# Patient Record
Sex: Female | Born: 1980 | Race: White | Hispanic: No | Marital: Married | Smoking: Never smoker
Health system: Southern US, Community
[De-identification: ages and names within clinical notes are randomized; demographics above are authoritative.]

## PROBLEM LIST (undated history)

## (undated) HISTORY — PX: BREAST SURGERY: SHX581

---
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
## 1999-11-14 ENCOUNTER — Encounter: Admission: RE | Admit: 1999-11-14 | Discharge: 1999-11-14 | Payer: Self-pay | Admitting: *Deleted

## 1999-12-28 ENCOUNTER — Ambulatory Visit (HOSPITAL_BASED_OUTPATIENT_CLINIC_OR_DEPARTMENT_OTHER): Admission: RE | Admit: 1999-12-28 | Discharge: 1999-12-28 | Payer: Self-pay | Admitting: *Deleted

## 1999-12-28 ENCOUNTER — Encounter (INDEPENDENT_AMBULATORY_CARE_PROVIDER_SITE_OTHER): Payer: Self-pay | Admitting: *Deleted

## 2000-11-21 ENCOUNTER — Other Ambulatory Visit: Admission: RE | Admit: 2000-11-21 | Discharge: 2000-11-21 | Payer: Self-pay | Admitting: Obstetrics and Gynecology

## 2002-01-04 ENCOUNTER — Other Ambulatory Visit: Admission: RE | Admit: 2002-01-04 | Discharge: 2002-01-04 | Payer: Self-pay | Admitting: Obstetrics and Gynecology

## 2004-02-17 ENCOUNTER — Other Ambulatory Visit: Admission: RE | Admit: 2004-02-17 | Discharge: 2004-02-17 | Payer: Self-pay | Admitting: Obstetrics and Gynecology

## 2005-02-27 ENCOUNTER — Other Ambulatory Visit: Admission: RE | Admit: 2005-02-27 | Discharge: 2005-02-27 | Payer: Self-pay | Admitting: Obstetrics and Gynecology

## 2006-03-04 ENCOUNTER — Other Ambulatory Visit: Admission: RE | Admit: 2006-03-04 | Discharge: 2006-03-04 | Payer: Self-pay | Admitting: Obstetrics and Gynecology

## 2007-07-15 ENCOUNTER — Inpatient Hospital Stay (HOSPITAL_COMMUNITY): Admission: AD | Admit: 2007-07-15 | Discharge: 2007-07-15 | Payer: Self-pay | Admitting: Obstetrics and Gynecology

## 2007-08-11 ENCOUNTER — Inpatient Hospital Stay (HOSPITAL_COMMUNITY): Admission: AD | Admit: 2007-08-11 | Discharge: 2007-08-14 | Payer: Self-pay | Admitting: Obstetrics and Gynecology

## 2007-08-12 ENCOUNTER — Encounter (INDEPENDENT_AMBULATORY_CARE_PROVIDER_SITE_OTHER): Payer: Self-pay

## 2009-09-12 DIAGNOSIS — D229 Melanocytic nevi, unspecified: Secondary | ICD-10-CM

## 2009-09-12 HISTORY — DX: Melanocytic nevi, unspecified: D22.9

## 2010-02-05 ENCOUNTER — Inpatient Hospital Stay (HOSPITAL_COMMUNITY)
Admission: AD | Admit: 2010-02-05 | Discharge: 2010-02-07 | Payer: Self-pay | Source: Home / Self Care | Attending: Obstetrics and Gynecology | Admitting: Obstetrics and Gynecology

## 2010-04-30 LAB — CBC
HCT: 27.8 % — ABNORMAL LOW (ref 36.0–46.0)
HCT: 36.3 % (ref 36.0–46.0)
Hemoglobin: 12.4 g/dL (ref 12.0–15.0)
Hemoglobin: 9.5 g/dL — ABNORMAL LOW (ref 12.0–15.0)
MCH: 30.8 pg (ref 26.0–34.0)
MCH: 30.9 pg (ref 26.0–34.0)
MCHC: 34.2 g/dL (ref 30.0–36.0)
MCHC: 34.2 g/dL (ref 30.0–36.0)
MCV: 90.3 fL (ref 78.0–100.0)
MCV: 90.5 fL (ref 78.0–100.0)
Platelets: 217 10*3/uL (ref 150–400)
Platelets: 242 10*3/uL (ref 150–400)
RBC: 3.08 MIL/uL — ABNORMAL LOW (ref 3.87–5.11)
RBC: 4.01 MIL/uL (ref 3.87–5.11)
RDW: 13.7 % (ref 11.5–15.5)
RDW: 14 % (ref 11.5–15.5)
WBC: 12.3 10*3/uL — ABNORMAL HIGH (ref 4.0–10.5)
WBC: 15 10*3/uL — ABNORMAL HIGH (ref 4.0–10.5)

## 2010-04-30 LAB — RPR: RPR Ser Ql: NONREACTIVE

## 2010-07-03 NOTE — H&P (Signed)
Ann, Hoffman                  ACCOUNT NO.:  [id number]   MEDICAL RECORD NO.:  [id number]          PATIENT TYPE:  INP   LOCATION:  9168                          FACILITY:  [facility]   PHYSICIAN:  Hal Morales, M.D.DATE OF BIRTH:  Oct 19, 1980   DATE OF ADMISSION:  08/11/2007  DATE OF DISCHARGE:                              HISTORY & PHYSICAL   HISTORY:  [name] is a 30 year old married white female gravida 2,  para 0-0-1-0 at 39-0/7th week.  She presents with leaking light green  fluids since 9 p.m. tonight with onset of regular uterine contractions  while she was on her way to [facility].  Her pregnancy has been  followed by the [facility] Certified Nurse Midwife [facility]  and has been remarkable for:  1. Irregular menses.  2. History of bilateral lumpectomies.  3. 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  4. Latex allergy.  5. Group B Strep negative.   Her prenatal labs were collected on January 27, 2007.  Hemoglobin 11.9,  hematocrit 34.6, platelets 232,000, blood type A positive, antibody  negative, RPR is nonreactive, rubella immune, hepatitis B surface  antigen negative, HIV nonreactive, and cystic fibrosis negative.  First  trimester screen from February 10, 2007, was within normal limits.  AFP  from March 18, 2007, was within normal limits.  1-hour Glucola from  May 14, 2007, was 117.  RPR at that time was nonreactive.  Hemoglobin  at that time was 11.8.  Cultures of vaginal tract for group B Strep on  July 23, 2007, was negative.   HISTORY OF PRESENT PREGNANCY:  The patient presented for care at [facility] on January 27, 2007, at 11 and 2/7th weeks gestation.  EDC was  determined by a 7-week ultrasound and that was to be on August 18, 2007.  Urine culture from the new OB showed positive Klebsiella pneumonia.  She  was given Bactrim for that.  She had a normal first trimester screen.  Anatomy ultrasound of [redacted] weeks gestation shows growth consistent with  previous dating.  All anatomy was seen.  She had a normal AST at that  ultrasound visit.  She had a urine culture sent that was positive for  enterococcus and that was at [redacted] weeks gestation.  She was treated with  Macrobid for that and then was started on suppressive therapy 100 mg  daily.  She had a normal 1-hour Glucola to considering Micronor for  contraception postpartum.  Rest of prenatal care was unremarkable.   OBSTETRIC HISTORY:  She is a gravida 2, para 0-0-1-0.  In July 2011, she  had an elective AB at 5-6 weeks' gestation.  This is second pregnancy is  the current pregnancy.   PAST MEDICAL HISTORY:  She is allergic to LATEX, resulting in hives.  She experienced menarche at the age of 47 with 28 day cycles lasting 5  days.  She states, she has been in the past for contraception, and she  stopped in January 2008.  She  had fibrocystic right breast with 2 cysts  removed in 2000 and 2001.  She reports having had the usual childhood  illnesses.   SURGICAL HISTORY:  Remarkable for lumpectomy x2 and wisdom teeth  extraction.   FAMILY MEDICAL HISTORY:  Paternal grandmother with angina.  Paternal  grandfather with 3 MIs.  Multiple family members with chronic  hypertension.  Mother with asthma.  Paternal uncle with type 1 diabetes.  Mother with hypothyroidism.  Paternal grandmother with CVA.  Paternal  grandmother with rheumatoid arthritis.  Maternal grandfather with colon  cancer.   GENETIC HISTORY:  Remarkable for twins on father of the baby side of the  family.   SOCIAL HISTORY:  The patient is married, lives with father of the baby.  His name is Riki Rusk.  He is involved and supportive.  They are of the  [place] faith.  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  Father of the baby has associates and he is  employed full time.  They deny alcohol, tobacco, or illicit drug use   with pregnancy.   OBJECTIVE DATA:  VITAL SIGNS:  Stable.  She is afebrile.  HEENT:  Grossly within normal limits.  CHEST:  Clear to auscultation.  HEART:  Regular rate and rhythm.  ABDOMEN:  Gravid and contour with fundal height extending approximately  38 cm above pubic symphysis.  Fetal heart rate is reassuring.  Negative  CST and positive scalp stimulation.  Contractions every 2-3 minutes.  Cervix is 2+ , 80% effaced, and vertex -1 with thin meconium-stained  fluid noted.  EXTREMITIES:  Normal.   ASSESSMENT:  1. Intrauterine pregnancy at term.  2. Spontaneous rupture of membranes.  3. Early labor.   PLAN:  1. To admit to birthing suite.  2. Routine CNM orders.  3. Undecided regarding pain medication.  4. Anticipated normal spontaneous vaginal birth.      Cam Hai, C.N.M.      Hal Morales, M.D.  Electronically Signed    KS/MEDQ  D:  08/12/2007  T:  08/12/2007  Job:  [id number]

## 2010-07-06 NOTE — Op Note (Signed)
[place]  Patient:    Ann Hoffman, Ann Hoffman                      MRN: [id number] Proc. Date: 12/28/99 Adm. Date:  [id number] Attending:  Stephenie Acres                           Operative Report  PREOPERATIVE DIAGNOSIS:  Right breast mass.  POSTOPERATIVE DIAGNOSIS:  Right breast mass.  OPERATION:  Excisional right breast biopsy.  SURGEON:  Catalina Lunger, M.D.  ANESTHESIA:  Local MAC  DESCRIPTION OF PROCEDURE:  The patient was taken to the operating room and placed in the supine position.  After adequate anesthesia was induced, the right breast was prepped and draped in a normal sterile fashion.  Using 0.25% Marcaine, the skin and subcutaneous tissue was injected.  Small incision was made over the palpable mass.  The mass was excised in its entirety.  Adequate hemostasis was ensured and the skin was closed with subcuticular 4-0 Monocryl. Steri-Strips and sterile dressing was applied.  The patient tolerated the procedure well and went to PACU in good condition. DD:  12/28/99 TD:  12/29/99 Job: [id number]

## 2010-11-14 LAB — URINE MICROSCOPIC-ADD ON

## 2010-11-14 LAB — STREP B DNA PROBE: Strep Group B Ag: NEGATIVE

## 2010-11-14 LAB — URINALYSIS, ROUTINE W REFLEX MICROSCOPIC
Bilirubin Urine: NEGATIVE
Glucose, UA: NEGATIVE
Hgb urine dipstick: NEGATIVE
Ketones, ur: NEGATIVE
Protein, ur: NEGATIVE
Urobilinogen, UA: 0.2

## 2010-11-15 LAB — RPR: RPR Ser Ql: NONREACTIVE

## 2010-11-15 LAB — CBC
HCT: 27.5 — ABNORMAL LOW
HCT: 34.6 — ABNORMAL LOW
Hemoglobin: 12
Hemoglobin: 9.6 — ABNORMAL LOW
MCHC: 34.3
MCV: 97.8
Platelets: 178
Platelets: 192
RBC: 2.84 — ABNORMAL LOW
RBC: 3.54 — ABNORMAL LOW
WBC: 10.2
WBC: 11.6 — ABNORMAL HIGH
WBC: 14.8 — ABNORMAL HIGH

## 2010-11-15 LAB — DIFFERENTIAL
Eosinophils Relative: 2
Lymphocytes Relative: 18
Lymphs Abs: 1.8

## 2010-11-15 LAB — CCBB MATERNAL DONOR DRAW

## 2012-01-29 ENCOUNTER — Ambulatory Visit (INDEPENDENT_AMBULATORY_CARE_PROVIDER_SITE_OTHER): Payer: 59

## 2012-01-29 VITALS — BP 100/62 | Resp 16 | Wt 127.0 lb

## 2012-01-29 DIAGNOSIS — Z124 Encounter for screening for malignant neoplasm of cervix: Secondary | ICD-10-CM

## 2012-01-29 NOTE — Progress Notes (Signed)
The patient reports:no complaints  Contraception:oral contraceptives (estrogen/progesterone)  Last mammogram: not since 2000 when the cysts were discovered Last pap: normal January 18, 2011  GC/Chlamydia cultures offered: declined HIV/RPR/HbsAg offered:  declined HSV 1 and 2 glycoprotein offered: declined  Menstrual cycle regular and monthly: Yes Menstrual flow normal: Yes  Urinary symptoms: none Normal bowel movements: Yes Reports abuse at home: No  .

## 2012-01-30 LAB — PAP IG W/ RFLX HPV ASCU

## 2012-02-06 ENCOUNTER — Other Ambulatory Visit: Payer: Self-pay

## 2012-02-06 DIAGNOSIS — Z309 Encounter for contraceptive management, unspecified: Secondary | ICD-10-CM

## 2012-02-06 MED ORDER — NORETHINDRONE 0.35 MG PO TABS: 1.0000 | ORAL_TABLET | Freq: Every day | ORAL | Status: AC

## 2012-03-05 ENCOUNTER — Telehealth: Payer: Self-pay | Admitting: Obstetrics and Gynecology

## 2012-03-05 NOTE — Telephone Encounter (Signed)
Spoke with pt rgd msg pt states had two cycles in one month first time its occurred no missed pills taking bc as directed advised pt not sure why having break thru bleeding may be that she needs another bc with more estrogen advised pt can bring her in for ov and eval or she can monitor and see if bleeding occurs next month then come in for eval pt states will monitor and see if irreg bleeding occurs again

## 2012-03-05 NOTE — Telephone Encounter (Signed)
Lm on vm tcb rgd msg 

## 2012-04-13 ENCOUNTER — Telehealth: Payer: Self-pay | Admitting: Obstetrics and Gynecology

## 2012-04-13 NOTE — Telephone Encounter (Signed)
TC to pt. States did not have menses as expected end of Jan.  Had bleeding x 2 days 2/13-2/14/14 which was middle of pack of pills. Advised to do UPT  Sched with [facility] 04/17/12 to accommodate pt's work schedule.

## 2012-04-17 ENCOUNTER — Ambulatory Visit: Payer: 59 | Admitting: Family Medicine

## 2012-04-17 ENCOUNTER — Encounter: Payer: Self-pay | Admitting: Family Medicine

## 2012-04-17 VITALS — BP 90/66 | Temp 98.0°F | Resp 18 | Wt 120.0 lb

## 2012-04-17 DIAGNOSIS — Z3202 Encounter for pregnancy test, result negative: Secondary | ICD-10-CM

## 2012-04-17 DIAGNOSIS — Z3041 Encounter for surveillance of contraceptive pills: Secondary | ICD-10-CM

## 2012-04-17 DIAGNOSIS — N921 Excessive and frequent menstruation with irregular cycle: Secondary | ICD-10-CM

## 2012-04-17 LAB — POCT URINALYSIS DIPSTICK
Bilirubin, UA: NEGATIVE
Blood, UA: NEGATIVE
Glucose, UA: NEGATIVE
Ketones, UA: NEGATIVE
Leukocytes, UA: NEGATIVE
Nitrite, UA: NEGATIVE
Protein, UA: NEGATIVE
Spec Grav, UA: 1.01
Urobilinogen, UA: NEGATIVE
pH, UA: 7

## 2012-04-17 LAB — POCT URINE PREGNANCY: Preg Test, Ur: NEGATIVE

## 2012-04-17 MED ORDER — NORGESTIM-ETH ESTRAD TRIPHASIC 0.18/0.215/0.25 MG-25 MCG PO TABS: 1.0000 | ORAL_TABLET | Freq: Every day | ORAL | Status: AC

## 2012-04-17 NOTE — Progress Notes (Signed)
S: Patient presents with c/o of irregular menses. Last normal menses was December, then started bleeding in January for 2 weeks, in February bleed x 1 day, continued bills thru 2/27 and currently on week 4 and still no bleeding.  Denies any new medications, lost 22# with diet and exercise, since August, but no other changes, takes pills daily and on time, not missing pills or skipping, last pap 01/2012 and normal.  Hx of irregualar menses in high school when off pills, otherwise been regular.  Denies pelvic pain, odor, discharge, or bleeding with intercourse. Nonsmoker.  Maternal history of hypothyroidism, patient only reports gradual hair loss, but not unusual.  No voice changes, heat or cold intolerances, constipation.  O: EGBUS: WNL, parous cervix without discharge or lesions, no CMT.  Adnexa without masses or tenderness. Uterus: mobile, nontender and appropriate size.  Small amount of brown blood in the vault.  A: Breakthrough Bleeding     UPT negative     TSH pending  P: D/C camila, Sunday Start Ortho-tricyclen LO 1 PO daily, 3 refills with condoms/foam/film for back up x 4 weeks.     RTO in 12 weeks for f/u

## 2012-04-18 LAB — TSH: TSH: 0.757 u[IU]/mL (ref 0.350–4.500)

## 2012-10-12 NOTE — Progress Notes (Signed)
..   Subjective:    Ann Hoffman is a 31y.o. MW female, [phone], who presents for an annual exam.  Pt is an Charity fundraiser.  Undecided on future pregnancies.   The patient reports:no complaints Contraception:oral contraceptives (estrogen/progesterone)  Last mammogram: not since 2000 when the cysts were discovered  Last pap: normal January 18, 2011  GC/Chlamydia cultures offered: declined  HIV/RPR/HbsAg offered: declined  HSV 1 and 2 glycoprotein offered: declined  Menstrual cycle regular and monthly: Yes  Menstrual flow normal: Yes  Urinary symptoms: none  Normal bowel movements: Yes  Reports abuse at home: No    History   Social History  . Marital Status: Married    Spouse Name: N/A    Number of Children: N/A  . Years of Education: N/A   Social History Main Topics  . Smoking status: Never Smoker   . Smokeless tobacco: Never Used  . Alcohol Use: 0.6 oz/week    1 Glasses of wine per week  . Drug Use: No  . Sexual Activity: Yes    Partners: Male    Birth Control/ Protection: Pill   Other Topics Concern  . None   Social History Narrative  . None   Past surgical Hx:  Bilateral breast lumpectomies--fibrocystic No PMH  Menstrual cycle:   LMP: Patient's last menstrual period was 01/23/2012.           Cycle: monthly on pills  The following portions of the patient's history were reviewed and updated as appropriate: allergies, current medications, past family history, past medical history, past social history, past surgical history and problem list.  Review of Systems Pertinent items are noted in HPI. Breast:Negative for breast lump,nipple discharge or nipple retraction Gastrointestinal: Negative for abdominal pain, change in bowel habits or rectal bleeding Urinary:negative   Objective:    BP 100/62  Resp 16  Wt 127 lb (57.607 kg)  LMP 01/23/2012    Weight:  Wt Readings from Last 1 Encounters:  04/17/12 120 lb (54.432 kg)          BMI: There is no height on file to calculate  BMI.  General Appearance: Alert, appropriate appearance for age. No acute distress HEENT: Grossly normal Neck / Thyroid: Supple, no masses, nodes or enlargement Lungs: clear to auscultation bilaterally Back: No CVA tenderness Breast Exam: No dimpling, nipple retraction or discharge. No masses or nodes. and No masses or nodes.No dimpling, nipple retraction or discharge. Cardiovascular: Regular rate and rhythm.  Gastrointestinal: Soft, non-tender, no masses or organomegaly Pelvic Exam: Vulva and vagina appear normal. Bimanual exam reveals normal uterus and adnexa. Rectovaginal: not indicated Lymphatic Exam: Non-palpable nodes in neck, clavicular, axillary. Skin: no rash or abnormalities Neurologic: Normal gait and speech, no tremor  Psychiatric: Alert and oriented, appropriate affect.   Wet Prep:not applicable Urinalysis:not applicable UPT: Not done   Assessment:    Normal gyn exam    Plan:    Mammogram--age 2 or prn pap smear done, next pap due 2014-2015 return annually or prn STD screening: declined Contraception:oral contraceptives (estrogen/progesterone) Rec'd daily vit, sunscreen, Kegels, healthy diet/exercise,lifestyle.  Rexene Edison, CNM Late entry from 01/29/12

## 2013-08-06 ENCOUNTER — Other Ambulatory Visit: Payer: Self-pay | Admitting: Physician Assistant

## 2013-12-20 ENCOUNTER — Encounter: Payer: Self-pay | Admitting: Family Medicine

## 2014-03-08 ENCOUNTER — Other Ambulatory Visit: Payer: Self-pay | Admitting: Physician Assistant

## 2016-06-20 ENCOUNTER — Other Ambulatory Visit: Payer: Self-pay | Admitting: Physician Assistant

## 2019-01-12 ENCOUNTER — Other Ambulatory Visit: Payer: Self-pay | Admitting: Physician Assistant

## 2019-01-12 DIAGNOSIS — D485 Neoplasm of uncertain behavior of skin: Secondary | ICD-10-CM | POA: Diagnosis not present

## 2019-01-12 DIAGNOSIS — D2362 Other benign neoplasm of skin of left upper limb, including shoulder: Secondary | ICD-10-CM | POA: Diagnosis not present

## 2019-02-02 DIAGNOSIS — Z01419 Encounter for gynecological examination (general) (routine) without abnormal findings: Secondary | ICD-10-CM | POA: Diagnosis not present

## 2019-02-02 DIAGNOSIS — Z6824 Body mass index (BMI) 24.0-24.9, adult: Secondary | ICD-10-CM | POA: Diagnosis not present

## 2019-02-22 DIAGNOSIS — Z20828 Contact with and (suspected) exposure to other viral communicable diseases: Secondary | ICD-10-CM | POA: Diagnosis not present

## 2019-04-09 DIAGNOSIS — Z30430 Encounter for insertion of intrauterine contraceptive device: Secondary | ICD-10-CM | POA: Diagnosis not present

## 2019-04-09 DIAGNOSIS — Z124 Encounter for screening for malignant neoplasm of cervix: Secondary | ICD-10-CM | POA: Diagnosis not present

## 2019-04-09 DIAGNOSIS — Z113 Encounter for screening for infections with a predominantly sexual mode of transmission: Secondary | ICD-10-CM | POA: Diagnosis not present

## 2019-05-11 DIAGNOSIS — R21 Rash and other nonspecific skin eruption: Secondary | ICD-10-CM | POA: Diagnosis not present

## 2019-05-11 DIAGNOSIS — J3081 Allergic rhinitis due to animal (cat) (dog) hair and dander: Secondary | ICD-10-CM | POA: Diagnosis not present

## 2019-05-11 DIAGNOSIS — J301 Allergic rhinitis due to pollen: Secondary | ICD-10-CM | POA: Diagnosis not present

## 2019-05-11 DIAGNOSIS — T781XXD Other adverse food reactions, not elsewhere classified, subsequent encounter: Secondary | ICD-10-CM | POA: Diagnosis not present

## 2019-06-01 DIAGNOSIS — Z30431 Encounter for routine checking of intrauterine contraceptive device: Secondary | ICD-10-CM | POA: Diagnosis not present

## 2019-07-13 ENCOUNTER — Encounter: Payer: Self-pay | Admitting: Physician Assistant

## 2019-07-13 ENCOUNTER — Ambulatory Visit: Payer: BC Managed Care – PPO | Admitting: Physician Assistant

## 2019-07-13 ENCOUNTER — Other Ambulatory Visit: Payer: Self-pay

## 2019-07-13 DIAGNOSIS — Z86018 Personal history of other benign neoplasm: Secondary | ICD-10-CM

## 2019-07-13 DIAGNOSIS — K130 Diseases of lips: Secondary | ICD-10-CM | POA: Diagnosis not present

## 2019-07-13 DIAGNOSIS — Z1283 Encounter for screening for malignant neoplasm of skin: Secondary | ICD-10-CM

## 2019-07-13 NOTE — Progress Notes (Signed)
   Follow-Up Visit   Subjective  Ann Hoffman is a 39 y.o. female who presents for the following: Follow-up (6 month mole check).   The following portions of the chart were reviewed this encounter and updated as appropriate: Tobacco  Allergies  Meds  Problems  Med Hx  Surg Hx  Fam Hx      Objective  Well appearing patient in no apparent distress; mood and affect are within normal limits.  A full examination was performed including scalp, head, eyes, ears, nose, lips, neck, chest, axillae, abdomen, back, buttocks, bilateral upper extremities, bilateral lower extremities, hands, feet, fingers, toes, fingernails, and toenails. All findings within normal limits unless otherwise noted below.  Objective  Left Lower Cutaneous Lip, Right Lower Cutaneous Lip: Erythematous scale  Objective  Head to toe: No DN, no signs of NMSC   Assessment & Plan  Angular cheilitis (2) Left Lower Cutaneous Lip; Right Lower Cutaneous Lip  Hydrocortisone ointment, clotrimazole cream  History of dysplastic nevus Right Shoulder - Posterior  Screening exam for skin cancer Head to toe  Yearly skin exam

## 2019-11-10 DIAGNOSIS — Z23 Encounter for immunization: Secondary | ICD-10-CM | POA: Diagnosis not present

## 2020-01-06 DIAGNOSIS — H05011 Cellulitis of right orbit: Secondary | ICD-10-CM | POA: Diagnosis not present

## 2020-02-01 ENCOUNTER — Other Ambulatory Visit: Payer: Self-pay

## 2020-02-01 ENCOUNTER — Telehealth: Payer: Self-pay | Admitting: *Deleted

## 2020-02-01 ENCOUNTER — Encounter: Payer: Self-pay | Admitting: Dermatology

## 2020-02-01 ENCOUNTER — Ambulatory Visit (INDEPENDENT_AMBULATORY_CARE_PROVIDER_SITE_OTHER): Payer: BC Managed Care – PPO | Admitting: Dermatology

## 2020-02-01 DIAGNOSIS — L700 Acne vulgaris: Secondary | ICD-10-CM

## 2020-02-01 MED ORDER — WINLEVI 1 % EX CREA: 1.0000 "application " | TOPICAL_CREAM | Freq: Every day | CUTANEOUS | 1 refills | Status: AC

## 2020-02-01 NOTE — Telephone Encounter (Signed)
Prior Authorization needed for Winlevi 1% Cream- done via cover my meds- Prior Authorization Approved.    Ann Hoffman (Key: [id number])  This request has received a Favorable outcome from [place].  Please keep in mind this is not a guarantee of payment. Eligibility and Benefit determinations will be made at the time of service.  Please note any additional information provided by [facility]  at the bottom of the screen.

## 2020-02-04 ENCOUNTER — Encounter: Payer: Self-pay | Admitting: Dermatology

## 2020-02-04 NOTE — Progress Notes (Signed)
   Follow-Up Visit   Subjective  Ann Hoffman is a 39 y.o. female who presents for the following: Acne (Patient d/c oral birth control now IUD acne on face tx sal acid, bpo wash and cream).  acne Location: Face Duration:  Quality: Flaring Associated Signs/Symptoms: Modifying Factors: Oral contraceptives Severity:  Timing: Context:   Objective  Well appearing patient in no apparent distress; mood and affect are within normal limits. Objective  Head - Anterior (Face): Acne chin under mask, historically deep inflammatory papules.  No clear-cut historical relationship to starting/stopping hormone therapies.  We discussed essentially old treatment options but in view of the hormone dependent relationship she and I agreed that a trial with class called her own seems most reasonable. Failed = minocycline 100mg  Benzaclin gel Retin A micro Accutane OTC BPO, DIFFERIN GEL    A focused examination was performed including Head and neck.. Relevant physical exam findings are noted in the Assessment and Plan.   Assessment & Plan    Acne vulgaris Head - Anterior (Face)  When lately applied to areas prone to acne nightly for 6 to 8-week trial.  Follow-up by MyChart or phone at that time.      I, Lavonna Monarch, MD, have reviewed all documentation for this visit.  The documentation on 02/04/20 for the exam, diagnosis, procedures, and orders are all accurate and complete.

## 2020-02-08 DIAGNOSIS — Z01419 Encounter for gynecological examination (general) (routine) without abnormal findings: Secondary | ICD-10-CM | POA: Diagnosis not present

## 2020-02-08 DIAGNOSIS — Z1231 Encounter for screening mammogram for malignant neoplasm of breast: Secondary | ICD-10-CM | POA: Diagnosis not present

## 2020-05-09 DIAGNOSIS — T781XXD Other adverse food reactions, not elsewhere classified, subsequent encounter: Secondary | ICD-10-CM | POA: Diagnosis not present

## 2020-05-09 DIAGNOSIS — J301 Allergic rhinitis due to pollen: Secondary | ICD-10-CM | POA: Diagnosis not present

## 2020-05-09 DIAGNOSIS — J3081 Allergic rhinitis due to animal (cat) (dog) hair and dander: Secondary | ICD-10-CM | POA: Diagnosis not present

## 2020-05-09 DIAGNOSIS — R21 Rash and other nonspecific skin eruption: Secondary | ICD-10-CM | POA: Diagnosis not present

## 2020-07-18 ENCOUNTER — Ambulatory Visit: Payer: BC Managed Care – PPO | Admitting: Physician Assistant

## 2020-10-10 DIAGNOSIS — Z0000 Encounter for general adult medical examination without abnormal findings: Secondary | ICD-10-CM | POA: Diagnosis not present

## 2020-10-10 DIAGNOSIS — Z13220 Encounter for screening for lipoid disorders: Secondary | ICD-10-CM | POA: Diagnosis not present

## 2020-10-10 DIAGNOSIS — Z1211 Encounter for screening for malignant neoplasm of colon: Secondary | ICD-10-CM | POA: Diagnosis not present

## 2020-10-10 DIAGNOSIS — Z0001 Encounter for general adult medical examination with abnormal findings: Secondary | ICD-10-CM | POA: Diagnosis not present

## 2020-10-10 DIAGNOSIS — E559 Vitamin D deficiency, unspecified: Secondary | ICD-10-CM | POA: Diagnosis not present

## 2020-10-10 DIAGNOSIS — F909 Attention-deficit hyperactivity disorder, unspecified type: Secondary | ICD-10-CM | POA: Diagnosis not present

## 2020-10-10 DIAGNOSIS — Z1231 Encounter for screening mammogram for malignant neoplasm of breast: Secondary | ICD-10-CM | POA: Diagnosis not present

## 2020-10-17 ENCOUNTER — Ambulatory Visit: Payer: BC Managed Care – PPO | Admitting: Physician Assistant

## 2020-10-17 ENCOUNTER — Other Ambulatory Visit: Payer: Self-pay

## 2020-10-17 ENCOUNTER — Encounter: Payer: Self-pay | Admitting: Physician Assistant

## 2020-10-17 DIAGNOSIS — Z808 Family history of malignant neoplasm of other organs or systems: Secondary | ICD-10-CM | POA: Diagnosis not present

## 2020-10-17 DIAGNOSIS — Z86018 Personal history of other benign neoplasm: Secondary | ICD-10-CM

## 2020-10-17 DIAGNOSIS — Z1283 Encounter for screening for malignant neoplasm of skin: Secondary | ICD-10-CM

## 2020-10-17 NOTE — Progress Notes (Signed)
   Follow-Up Visit   Subjective  Ann Hoffman is a 40 y.o. female who presents for the following: Annual Exam (No new concerns. Personal history of several atypical nevi but no non mole skin cancer.  Does have family history of melanoma. ).   The following portions of the chart were reviewed this encounter and updated as appropriate:  Tobacco  Allergies  Meds  Problems  Med Hx  Surg Hx  Fam Hx      Objective  Well appearing patient in no apparent distress; mood and affect are within normal limits.  A full examination was performed including scalp, head, eyes, ears, nose, lips, neck, chest, axillae, abdomen, back, buttocks, bilateral upper extremities, bilateral lower extremities, hands, feet, fingers, toes, fingernails, and toenails. All findings within normal limits unless otherwise noted below.  Head to toe No atypical nevi No signs of non-mole skin cancer.    Assessment & Plan  Encounter for screening for malignant neoplasm of skin Head to toe  Yearly skin examinations.    I, Eliese Kerwood, PA-C, have reviewed all documentation's for this visit.  The documentation on 10/17/20 for the exam, diagnosis, procedures and orders are all accurate and complete.

## 2020-11-09 DIAGNOSIS — Z23 Encounter for immunization: Secondary | ICD-10-CM | POA: Diagnosis not present

## 2020-12-12 DIAGNOSIS — Z79899 Other long term (current) drug therapy: Secondary | ICD-10-CM | POA: Diagnosis not present

## 2020-12-12 DIAGNOSIS — F902 Attention-deficit hyperactivity disorder, combined type: Secondary | ICD-10-CM | POA: Diagnosis not present

## 2021-01-17 ENCOUNTER — Telehealth: Payer: Self-pay | Admitting: *Deleted

## 2021-01-17 NOTE — Telephone Encounter (Signed)
Prior authorization done through cover my meds. [id number]  MIAMI LATULIPPE (Key: [id number])  Your information has been submitted to [place]. [employer] [place] will review the request and notify you of the determination decision directly, typically within 72 hours of receiving all information.  You will also receive your request decision electronically. To check for an update later, open this request again from your dashboard.  If [employer] [place] has not responded within the specified timeframe or if you have any questions about your PA submission, contact [place]  directly at [phone].

## 2021-01-22 ENCOUNTER — Telehealth: Payer: Self-pay | Admitting: *Deleted

## 2021-01-22 MED ORDER — WINLEVI 1 % EX CREA: 1.0000 "application " | TOPICAL_CREAM | Freq: Every day | CUTANEOUS | 3 refills | Status: AC

## 2021-01-22 NOTE — Telephone Encounter (Signed)
Patients Ann Hoffman was denied. Sent to [facility]. Information sent to the patient.

## 2021-02-27 DIAGNOSIS — Z01419 Encounter for gynecological examination (general) (routine) without abnormal findings: Secondary | ICD-10-CM | POA: Diagnosis not present

## 2021-02-27 DIAGNOSIS — Z1231 Encounter for screening mammogram for malignant neoplasm of breast: Secondary | ICD-10-CM | POA: Diagnosis not present

## 2021-03-06 DIAGNOSIS — F909 Attention-deficit hyperactivity disorder, unspecified type: Secondary | ICD-10-CM | POA: Diagnosis not present

## 2021-03-06 DIAGNOSIS — Z79899 Other long term (current) drug therapy: Secondary | ICD-10-CM | POA: Diagnosis not present

## 2021-07-23 ENCOUNTER — Other Ambulatory Visit (HOSPITAL_COMMUNITY): Payer: Self-pay

## 2021-07-23 MED ORDER — VYVANSE 30 MG PO CAPS
30.0000 mg | ORAL_CAPSULE | Freq: Every day | ORAL | 0 refills | Status: DC
  Filled 2021-07-23: qty 30, 30d supply, fill #0

## 2021-07-24 ENCOUNTER — Other Ambulatory Visit (HOSPITAL_COMMUNITY): Payer: Self-pay

## 2021-08-23 ENCOUNTER — Other Ambulatory Visit (HOSPITAL_COMMUNITY): Payer: Self-pay

## 2021-08-23 MED ORDER — VYVANSE 30 MG PO CAPS
30.0000 mg | ORAL_CAPSULE | Freq: Every day | ORAL | 0 refills | Status: AC
  Filled 2021-08-23: qty 30, 30d supply, fill #0

## 2021-09-14 ENCOUNTER — Other Ambulatory Visit (HOSPITAL_COMMUNITY): Payer: Self-pay

## 2021-09-14 MED ORDER — VYVANSE 30 MG PO CAPS
30.0000 mg | ORAL_CAPSULE | Freq: Every day | ORAL | 0 refills | Status: AC
  Filled 2021-09-14 – 2021-09-24 (×2): qty 30, 30d supply, fill #0

## 2021-09-14 MED ORDER — VYVANSE 30 MG PO CAPS
30.0000 mg | ORAL_CAPSULE | Freq: Every day | ORAL | 0 refills | Status: AC
  Filled 2021-10-23: qty 30, 30d supply, fill #0

## 2021-09-14 MED ORDER — VYVANSE 30 MG PO CAPS: 30.0000 mg | ORAL_CAPSULE | Freq: Every day | ORAL | 0 refills | Status: AC

## 2021-09-24 ENCOUNTER — Other Ambulatory Visit (HOSPITAL_COMMUNITY): Payer: Self-pay

## 2021-10-23 ENCOUNTER — Ambulatory Visit: Payer: BC Managed Care – PPO | Admitting: Physician Assistant

## 2021-10-23 ENCOUNTER — Other Ambulatory Visit (HOSPITAL_COMMUNITY): Payer: Self-pay

## 2021-11-22 ENCOUNTER — Other Ambulatory Visit (HOSPITAL_COMMUNITY): Payer: Self-pay

## 2021-11-22 MED ORDER — LISDEXAMFETAMINE DIMESYLATE 30 MG PO CAPS
30.0000 mg | ORAL_CAPSULE | Freq: Every day | ORAL | 0 refills | Status: AC
  Filled 2021-11-22: qty 30, 30d supply, fill #0

## 2021-12-20 ENCOUNTER — Other Ambulatory Visit (HOSPITAL_COMMUNITY): Payer: Self-pay

## 2021-12-20 MED ORDER — LISDEXAMFETAMINE DIMESYLATE 30 MG PO CAPS
30.0000 mg | ORAL_CAPSULE | Freq: Every day | ORAL | 0 refills | Status: DC
  Filled 2021-12-20: qty 30, 30d supply, fill #0

## 2022-01-24 ENCOUNTER — Other Ambulatory Visit (HOSPITAL_COMMUNITY): Payer: Self-pay

## 2022-01-24 MED ORDER — LISDEXAMFETAMINE DIMESYLATE 30 MG PO CAPS
30.0000 mg | ORAL_CAPSULE | Freq: Every day | ORAL | 0 refills | Status: AC
  Filled 2022-01-24: qty 30, 30d supply, fill #0

## 2022-01-25 ENCOUNTER — Other Ambulatory Visit (HOSPITAL_COMMUNITY): Payer: Self-pay

## 2022-01-25 MED ORDER — LISDEXAMFETAMINE DIMESYLATE 30 MG PO CAPS: 30.0000 mg | ORAL_CAPSULE | Freq: Every day | ORAL | 0 refills | Status: AC

## 2022-02-22 ENCOUNTER — Other Ambulatory Visit (HOSPITAL_COMMUNITY): Payer: Self-pay

## 2022-02-22 MED ORDER — LISDEXAMFETAMINE DIMESYLATE 30 MG PO CAPS
30.0000 mg | ORAL_CAPSULE | Freq: Every day | ORAL | 0 refills | Status: AC
  Filled 2022-02-22: qty 30, 30d supply, fill #0

## 2022-02-27 ENCOUNTER — Other Ambulatory Visit (HOSPITAL_COMMUNITY): Payer: Self-pay

## 2022-02-27 DIAGNOSIS — F902 Attention-deficit hyperactivity disorder, combined type: Secondary | ICD-10-CM | POA: Diagnosis not present

## 2022-02-27 MED ORDER — LISDEXAMFETAMINE DIMESYLATE 30 MG PO CAPS
30.0000 mg | ORAL_CAPSULE | Freq: Every morning | ORAL | 0 refills | Status: DC
  Filled 2022-03-26 (×2): qty 30, 30d supply, fill #0

## 2022-03-20 DIAGNOSIS — Z01411 Encounter for gynecological examination (general) (routine) with abnormal findings: Secondary | ICD-10-CM | POA: Diagnosis not present

## 2022-03-20 DIAGNOSIS — Z1231 Encounter for screening mammogram for malignant neoplasm of breast: Secondary | ICD-10-CM | POA: Diagnosis not present

## 2022-03-20 DIAGNOSIS — Z113 Encounter for screening for infections with a predominantly sexual mode of transmission: Secondary | ICD-10-CM | POA: Diagnosis not present

## 2022-03-20 DIAGNOSIS — Z124 Encounter for screening for malignant neoplasm of cervix: Secondary | ICD-10-CM | POA: Diagnosis not present

## 2022-03-20 DIAGNOSIS — Z975 Presence of (intrauterine) contraceptive device: Secondary | ICD-10-CM | POA: Diagnosis not present

## 2022-03-20 DIAGNOSIS — Z01419 Encounter for gynecological examination (general) (routine) without abnormal findings: Secondary | ICD-10-CM | POA: Diagnosis not present

## 2022-03-26 ENCOUNTER — Other Ambulatory Visit (HOSPITAL_BASED_OUTPATIENT_CLINIC_OR_DEPARTMENT_OTHER): Payer: Self-pay

## 2022-03-26 ENCOUNTER — Other Ambulatory Visit: Payer: Self-pay

## 2022-03-26 ENCOUNTER — Other Ambulatory Visit (HOSPITAL_COMMUNITY): Payer: Self-pay

## 2022-04-16 DIAGNOSIS — D3132 Benign neoplasm of left choroid: Secondary | ICD-10-CM | POA: Diagnosis not present

## 2022-04-16 DIAGNOSIS — H5203 Hypermetropia, bilateral: Secondary | ICD-10-CM | POA: Diagnosis not present

## 2022-04-29 ENCOUNTER — Other Ambulatory Visit (HOSPITAL_BASED_OUTPATIENT_CLINIC_OR_DEPARTMENT_OTHER): Payer: Self-pay

## 2022-04-29 MED ORDER — LISDEXAMFETAMINE DIMESYLATE 30 MG PO CAPS
30.0000 mg | ORAL_CAPSULE | Freq: Every morning | ORAL | 0 refills | Status: AC
  Filled 2022-04-29: qty 30, 30d supply, fill #0

## 2022-05-01 ENCOUNTER — Other Ambulatory Visit (HOSPITAL_BASED_OUTPATIENT_CLINIC_OR_DEPARTMENT_OTHER): Payer: Self-pay

## 2022-05-07 ENCOUNTER — Other Ambulatory Visit (HOSPITAL_BASED_OUTPATIENT_CLINIC_OR_DEPARTMENT_OTHER): Payer: Self-pay

## 2022-05-14 ENCOUNTER — Other Ambulatory Visit (HOSPITAL_BASED_OUTPATIENT_CLINIC_OR_DEPARTMENT_OTHER): Payer: Self-pay

## 2022-05-21 ENCOUNTER — Other Ambulatory Visit (HOSPITAL_BASED_OUTPATIENT_CLINIC_OR_DEPARTMENT_OTHER): Payer: Self-pay

## 2022-05-22 ENCOUNTER — Other Ambulatory Visit: Payer: Self-pay

## 2022-06-19 DIAGNOSIS — D3132 Benign neoplasm of left choroid: Secondary | ICD-10-CM | POA: Diagnosis not present

## 2022-07-11 DIAGNOSIS — J301 Allergic rhinitis due to pollen: Secondary | ICD-10-CM | POA: Diagnosis not present

## 2022-07-11 DIAGNOSIS — R21 Rash and other nonspecific skin eruption: Secondary | ICD-10-CM | POA: Diagnosis not present

## 2022-07-11 DIAGNOSIS — J3081 Allergic rhinitis due to animal (cat) (dog) hair and dander: Secondary | ICD-10-CM | POA: Diagnosis not present

## 2022-07-11 DIAGNOSIS — T781XXD Other adverse food reactions, not elsewhere classified, subsequent encounter: Secondary | ICD-10-CM | POA: Diagnosis not present

## 2022-07-22 DIAGNOSIS — J3081 Allergic rhinitis due to animal (cat) (dog) hair and dander: Secondary | ICD-10-CM | POA: Diagnosis not present

## 2022-07-22 DIAGNOSIS — J301 Allergic rhinitis due to pollen: Secondary | ICD-10-CM | POA: Diagnosis not present

## 2022-07-23 DIAGNOSIS — J3089 Other allergic rhinitis: Secondary | ICD-10-CM | POA: Diagnosis not present

## 2022-07-29 ENCOUNTER — Other Ambulatory Visit (HOSPITAL_BASED_OUTPATIENT_CLINIC_OR_DEPARTMENT_OTHER): Payer: Self-pay

## 2022-07-29 MED ORDER — EPINEPHRINE 0.3 MG/0.3ML IJ SOAJ
INTRAMUSCULAR | 1 refills | Status: AC
  Filled 2022-07-29: qty 2, 30d supply, fill #0

## 2022-08-12 ENCOUNTER — Other Ambulatory Visit (HOSPITAL_BASED_OUTPATIENT_CLINIC_OR_DEPARTMENT_OTHER): Payer: Self-pay

## 2022-12-05 ENCOUNTER — Other Ambulatory Visit (HOSPITAL_BASED_OUTPATIENT_CLINIC_OR_DEPARTMENT_OTHER): Payer: Self-pay

## 2022-12-05 MED ORDER — FLULAVAL 0.5 ML IM SUSY
0.5000 mL | PREFILLED_SYRINGE | Freq: Once | INTRAMUSCULAR | 0 refills | Status: AC
  Filled 2022-12-05: qty 0.5, 1d supply, fill #0

## 2022-12-10 DIAGNOSIS — L814 Other melanin hyperpigmentation: Secondary | ICD-10-CM | POA: Diagnosis not present

## 2022-12-10 DIAGNOSIS — Z85828 Personal history of other malignant neoplasm of skin: Secondary | ICD-10-CM | POA: Diagnosis not present

## 2022-12-10 DIAGNOSIS — L578 Other skin changes due to chronic exposure to nonionizing radiation: Secondary | ICD-10-CM | POA: Diagnosis not present

## 2022-12-10 DIAGNOSIS — D229 Melanocytic nevi, unspecified: Secondary | ICD-10-CM | POA: Diagnosis not present

## 2022-12-10 DIAGNOSIS — L821 Other seborrheic keratosis: Secondary | ICD-10-CM | POA: Diagnosis not present

## 2022-12-10 DIAGNOSIS — L570 Actinic keratosis: Secondary | ICD-10-CM | POA: Diagnosis not present

## 2022-12-18 DIAGNOSIS — Z1329 Encounter for screening for other suspected endocrine disorder: Secondary | ICD-10-CM | POA: Diagnosis not present

## 2022-12-18 DIAGNOSIS — Z13220 Encounter for screening for lipoid disorders: Secondary | ICD-10-CM | POA: Diagnosis not present

## 2022-12-18 DIAGNOSIS — Z0000 Encounter for general adult medical examination without abnormal findings: Secondary | ICD-10-CM | POA: Diagnosis not present

## 2023-04-03 DIAGNOSIS — Z1231 Encounter for screening mammogram for malignant neoplasm of breast: Secondary | ICD-10-CM | POA: Diagnosis not present

## 2023-04-03 DIAGNOSIS — Z01411 Encounter for gynecological examination (general) (routine) with abnormal findings: Secondary | ICD-10-CM | POA: Diagnosis not present

## 2023-04-03 DIAGNOSIS — Z113 Encounter for screening for infections with a predominantly sexual mode of transmission: Secondary | ICD-10-CM | POA: Diagnosis not present

## 2023-04-03 DIAGNOSIS — Z01419 Encounter for gynecological examination (general) (routine) without abnormal findings: Secondary | ICD-10-CM | POA: Diagnosis not present

## 2023-04-03 DIAGNOSIS — Z124 Encounter for screening for malignant neoplasm of cervix: Secondary | ICD-10-CM | POA: Diagnosis not present

## 2023-04-29 DIAGNOSIS — D3132 Benign neoplasm of left choroid: Secondary | ICD-10-CM | POA: Diagnosis not present

## 2023-12-09 ENCOUNTER — Other Ambulatory Visit (HOSPITAL_BASED_OUTPATIENT_CLINIC_OR_DEPARTMENT_OTHER): Payer: Self-pay

## 2023-12-09 MED ORDER — FLUZONE 0.5 ML IM SUSY
0.5000 mL | PREFILLED_SYRINGE | Freq: Once | INTRAMUSCULAR | 0 refills | Status: AC
  Filled 2023-12-09: qty 0.5, 1d supply, fill #0

## 2023-12-11 ENCOUNTER — Other Ambulatory Visit (HOSPITAL_BASED_OUTPATIENT_CLINIC_OR_DEPARTMENT_OTHER): Payer: Self-pay

## 2023-12-11 DIAGNOSIS — Z85828 Personal history of other malignant neoplasm of skin: Secondary | ICD-10-CM | POA: Diagnosis not present

## 2023-12-11 DIAGNOSIS — L578 Other skin changes due to chronic exposure to nonionizing radiation: Secondary | ICD-10-CM | POA: Diagnosis not present

## 2023-12-11 DIAGNOSIS — L821 Other seborrheic keratosis: Secondary | ICD-10-CM | POA: Diagnosis not present

## 2023-12-11 DIAGNOSIS — L570 Actinic keratosis: Secondary | ICD-10-CM | POA: Diagnosis not present

## 2023-12-11 DIAGNOSIS — D225 Melanocytic nevi of trunk: Secondary | ICD-10-CM | POA: Diagnosis not present

## 2023-12-11 DIAGNOSIS — D235 Other benign neoplasm of skin of trunk: Secondary | ICD-10-CM | POA: Diagnosis not present

## 2023-12-11 DIAGNOSIS — D485 Neoplasm of uncertain behavior of skin: Secondary | ICD-10-CM | POA: Diagnosis not present

## 2023-12-11 DIAGNOSIS — L814 Other melanin hyperpigmentation: Secondary | ICD-10-CM | POA: Diagnosis not present

## 2023-12-11 DIAGNOSIS — D229 Melanocytic nevi, unspecified: Secondary | ICD-10-CM | POA: Diagnosis not present

## 2023-12-11 DIAGNOSIS — D1801 Hemangioma of skin and subcutaneous tissue: Secondary | ICD-10-CM | POA: Diagnosis not present

## 2023-12-11 MED ORDER — FLUOROURACIL 5 % EX CREA
1.0000 | TOPICAL_CREAM | Freq: Every day | CUTANEOUS | 0 refills | Status: AC
  Filled 2023-12-11: qty 40, 30d supply, fill #0

## 2023-12-22 DIAGNOSIS — Z0000 Encounter for general adult medical examination without abnormal findings: Secondary | ICD-10-CM | POA: Diagnosis not present

## 2023-12-22 DIAGNOSIS — Z1329 Encounter for screening for other suspected endocrine disorder: Secondary | ICD-10-CM | POA: Diagnosis not present

## 2023-12-22 DIAGNOSIS — Z13220 Encounter for screening for lipoid disorders: Secondary | ICD-10-CM | POA: Diagnosis not present

## 2024-02-26 ENCOUNTER — Other Ambulatory Visit (HOSPITAL_BASED_OUTPATIENT_CLINIC_OR_DEPARTMENT_OTHER): Payer: Self-pay

## 2024-02-26 MED ORDER — HYDROCODONE BIT-HOMATROP MBR 5-1.5 MG/5ML PO SOLN
ORAL | 0 refills | Status: AC
  Filled 2024-02-26: qty 120, 10d supply, fill #0

## 2024-02-26 MED ORDER — PREDNISONE 10 MG PO TABS
ORAL_TABLET | ORAL | 0 refills | Status: AC
  Filled 2024-02-26: qty 48, 12d supply, fill #0

## 2024-02-26 MED ORDER — AZITHROMYCIN 250 MG PO TABS
ORAL_TABLET | ORAL | 0 refills | Status: AC
  Filled 2024-02-26: qty 6, 5d supply, fill #0

## 2024-03-03 ENCOUNTER — Other Ambulatory Visit (HOSPITAL_BASED_OUTPATIENT_CLINIC_OR_DEPARTMENT_OTHER): Payer: Self-pay

## 2024-03-03 MED ORDER — LEVOFLOXACIN 500 MG PO TABS
500.0000 mg | ORAL_TABLET | Freq: Every day | ORAL | 0 refills | Status: AC
  Filled 2024-03-03: qty 7, 7d supply, fill #0
